# Patient Record
Sex: Female | Born: 1960 | Race: White | Hispanic: No | Marital: Married | Smoking: Current every day smoker
Health system: Southern US, Community
[De-identification: ages and names within clinical notes are randomized; demographics above are authoritative.]

## PROBLEM LIST (undated history)

## (undated) DIAGNOSIS — Z973 Presence of spectacles and contact lenses: Secondary | ICD-10-CM

## (undated) DIAGNOSIS — B009 Herpesviral infection, unspecified: Secondary | ICD-10-CM

## (undated) DIAGNOSIS — R51 Headache: Secondary | ICD-10-CM

## (undated) HISTORY — PX: CLOSED REDUCTION ELBOW DISLOCATION: SUR215

## (undated) HISTORY — DX: Presence of spectacles and contact lenses: Z97.3

## (undated) HISTORY — DX: Herpesviral infection, unspecified: B00.9

## (undated) HISTORY — DX: Headache: R51

---

## 2007-07-25 ENCOUNTER — Emergency Department (HOSPITAL_BASED_OUTPATIENT_CLINIC_OR_DEPARTMENT_OTHER): Admission: EM | Admit: 2007-07-25 | Discharge: 2007-07-25 | Payer: Self-pay | Admitting: Emergency Medicine

## 2008-06-07 ENCOUNTER — Ambulatory Visit: Payer: Self-pay | Admitting: Diagnostic Radiology

## 2008-06-07 ENCOUNTER — Ambulatory Visit (HOSPITAL_BASED_OUTPATIENT_CLINIC_OR_DEPARTMENT_OTHER): Admission: RE | Admit: 2008-06-07 | Discharge: 2008-06-07 | Payer: Self-pay | Admitting: Obstetrics and Gynecology

## 2009-09-15 ENCOUNTER — Encounter: Admission: RE | Admit: 2009-09-15 | Discharge: 2009-09-15 | Payer: Self-pay | Admitting: Obstetrics and Gynecology

## 2010-04-15 ENCOUNTER — Other Ambulatory Visit: Payer: Self-pay | Admitting: Obstetrics and Gynecology

## 2010-04-15 DIAGNOSIS — Z09 Encounter for follow-up examination after completed treatment for conditions other than malignant neoplasm: Secondary | ICD-10-CM

## 2010-04-21 ENCOUNTER — Ambulatory Visit
Admission: RE | Admit: 2010-04-21 | Discharge: 2010-04-21 | Disposition: A | Discharge status: Home / Self Care | Payer: 59 | Source: Ambulatory Visit | Attending: Obstetrics and Gynecology | Admitting: Obstetrics and Gynecology

## 2010-04-21 DIAGNOSIS — Z09 Encounter for follow-up examination after completed treatment for conditions other than malignant neoplasm: Secondary | ICD-10-CM

## 2010-08-27 ENCOUNTER — Encounter (INDEPENDENT_AMBULATORY_CARE_PROVIDER_SITE_OTHER): Payer: Self-pay | Admitting: General Surgery

## 2010-08-28 ENCOUNTER — Encounter (INDEPENDENT_AMBULATORY_CARE_PROVIDER_SITE_OTHER): Payer: Self-pay | Admitting: General Surgery

## 2010-08-28 ENCOUNTER — Ambulatory Visit (INDEPENDENT_AMBULATORY_CARE_PROVIDER_SITE_OTHER): Payer: 59 | Admitting: General Surgery

## 2010-08-28 VITALS — BP 112/78 | HR 72 | Temp 97.3°F | Ht 67.0 in | Wt 155.0 lb

## 2010-08-28 DIAGNOSIS — D171 Benign lipomatous neoplasm of skin and subcutaneous tissue of trunk: Secondary | ICD-10-CM

## 2010-08-28 DIAGNOSIS — D1739 Benign lipomatous neoplasm of skin and subcutaneous tissue of other sites: Secondary | ICD-10-CM

## 2010-08-28 NOTE — Progress Notes (Signed)
Stephanie Pitts is a 50 y.o. female.    Chief Complaint  Patient presents with  . Other    lump on left side    HPI HPI Pt felt a mass along her left abdominal wall several weeks ago.  She felt some discomfort when she lay on that side, pressing on it.  It has not been red.  She has not needed to take analgesics for it.  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  She denies skin changes.    Past Medical History  Diagnosis Date  . Herpes   . Headache   . Wears glasses     Past Surgical History  Procedure Date  . Cesarean section   . Closed reduction elbow dislocation     Family History  Problem Relation Age of Onset  . Diabetes Father     Social History History  Substance Use Topics  . Smoking status: Never Smoker   . Smokeless tobacco: Not on file  . Alcohol Use: 0.6 oz/week    1 Glasses of wine per week    No Known Allergies  Current Outpatient Prescriptions  Medication Sig Dispense Refill  . Naproxen Sodium (ALEVE PO) Take by mouth 2 (two) times a week.          Review of Systems Review of Systems  Constitutional: Negative.   HENT: Negative.   Eyes: Negative.   Respiratory: Negative.   Cardiovascular: Negative.   Gastrointestinal: Negative.   Genitourinary: Negative.   Musculoskeletal: Negative.   Skin: Negative.   Neurological:       Headaches     Physical Exam Physical Exam  Constitutional: She is oriented to person, place, and time. She appears well-developed and well-nourished.  HENT:  Head: Normocephalic and atraumatic.  Mouth/Throat: Oropharynx is clear and moist. No oropharyngeal exudate.  Eyes: Conjunctivae are normal. Pupils are equal, round, and reactive to light. No scleral icterus.  Neck: Normal range of motion. Neck supple. No tracheal deviation present. No thyromegaly present.  Cardiovascular: Normal rate, regular rhythm and intact distal pulses.   Respiratory: Effort normal.  No respiratory distress. She exhibits no tenderness.  GI: Soft. Bowel sounds are normal. She exhibits mass (see diagram). She exhibits no distension. There is no tenderness. There is no rebound and no guarding.         2 x 4 cm flat mass over 11th rib  Musculoskeletal: Normal range of motion. She exhibits no edema and no tenderness.  Lymphadenopathy:    She has no cervical adenopathy.  Neurological: She is alert and oriented to person, place, and time. Coordination normal.  Skin: Skin is warm and dry. No rash noted. No erythema. No pallor.          Sacral mass, fatty, difficult to palpate, 1.5-2 cm  Psychiatric: She has a normal mood and affect. Her behavior is normal. Judgment and thought content normal.     There were no vitals taken for this visit.  Assessment/Plan Lipoma of abdominal wall Follow up if mass is enlarging or more painful I can resect mass, but there is a risk of infection, bleeding, post op pain, and expense. Right now, mass is small and minimally symptomatic.        [name] 08/28/2010, 10:58 AM

## 2010-08-28 NOTE — Assessment & Plan Note (Signed)
Follow up if mass is enlarging or more painful I can resect mass, but there is a risk of infection, bleeding, post op pain, and expense. Right now, mass is small and minimally symptomatic.

## 2011-10-16 IMAGING — US US BREAST L
1 series · 4 of 4 positions shown · non-contrast
Comparison: 06/07/2008

CLINICAL DATA: Left breast mass

DIGITAL DIAGNOSTIC BILATERAL MAMMOGRAM WITH CAD AND LEFT BREAST
ULTRASOUND:

[Series 1: us breast left · 4 of 4 slices shown]
[im 1/4]
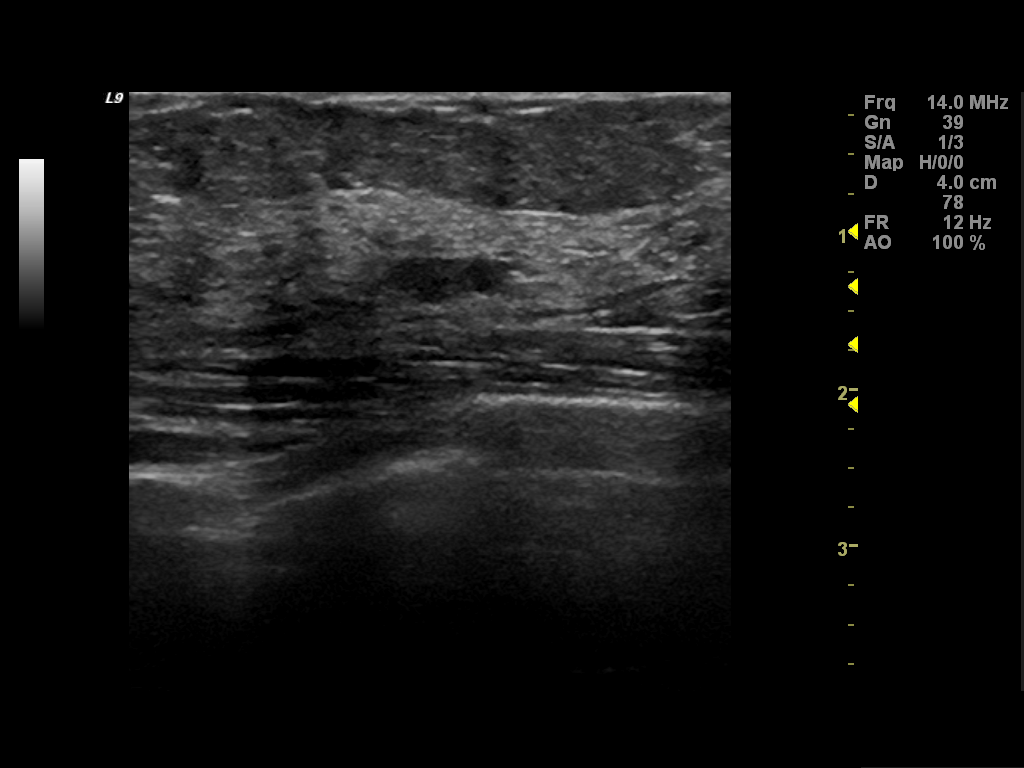
[im 2/4]
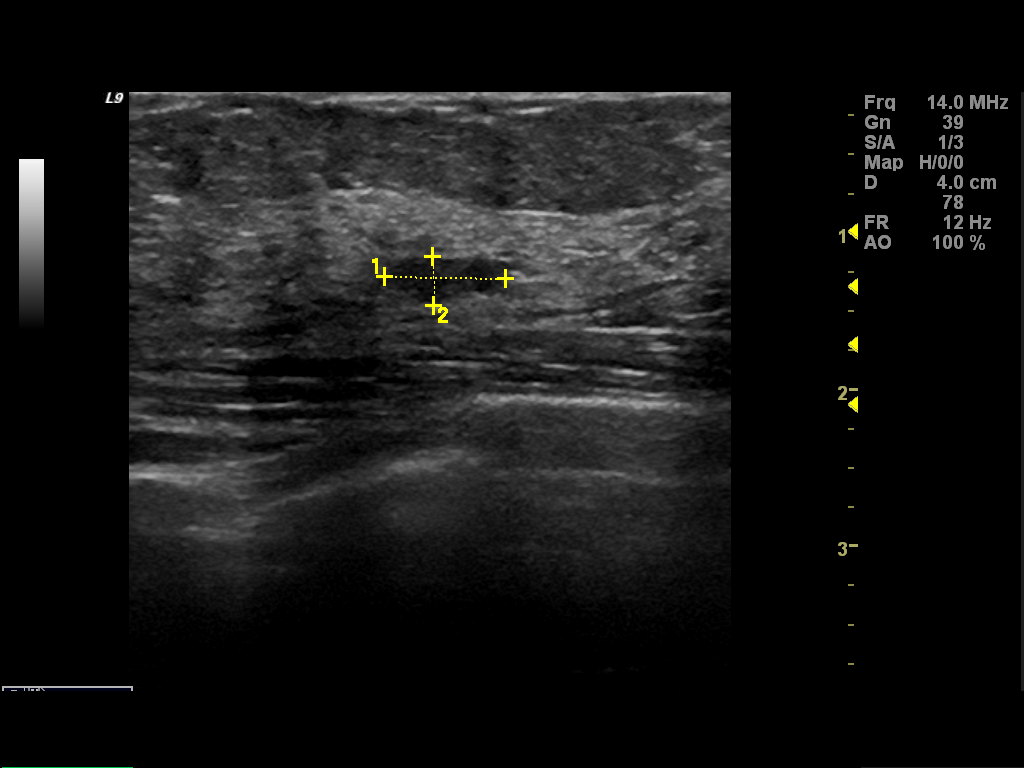
[im 3/4]
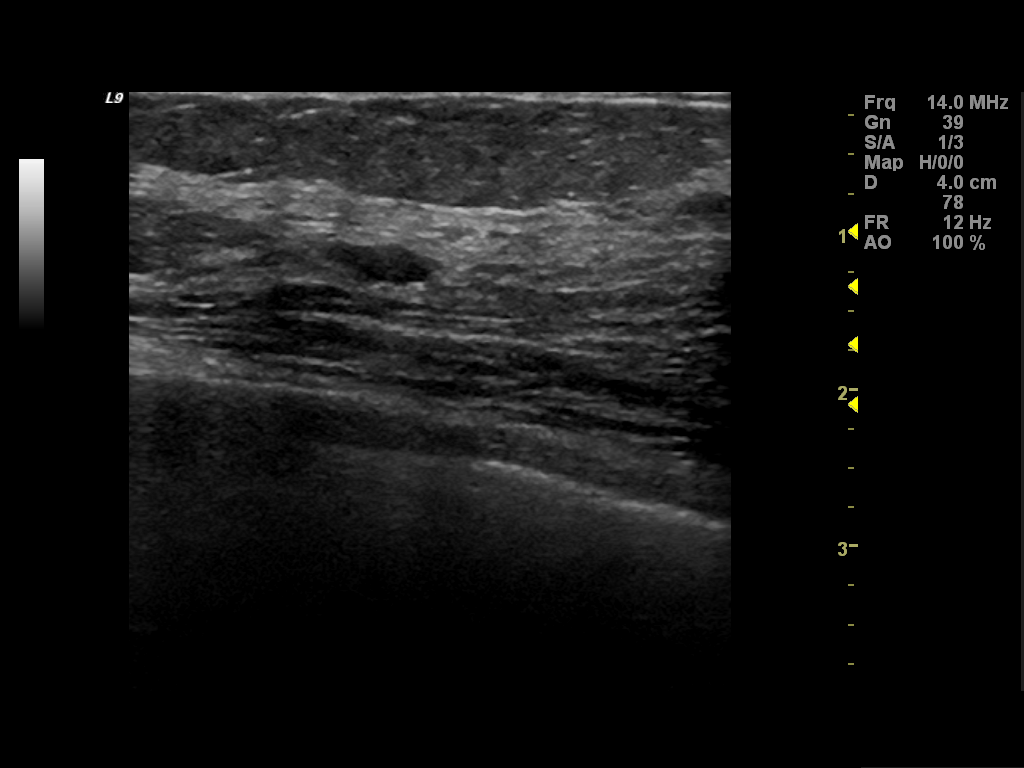
[im 4/4]
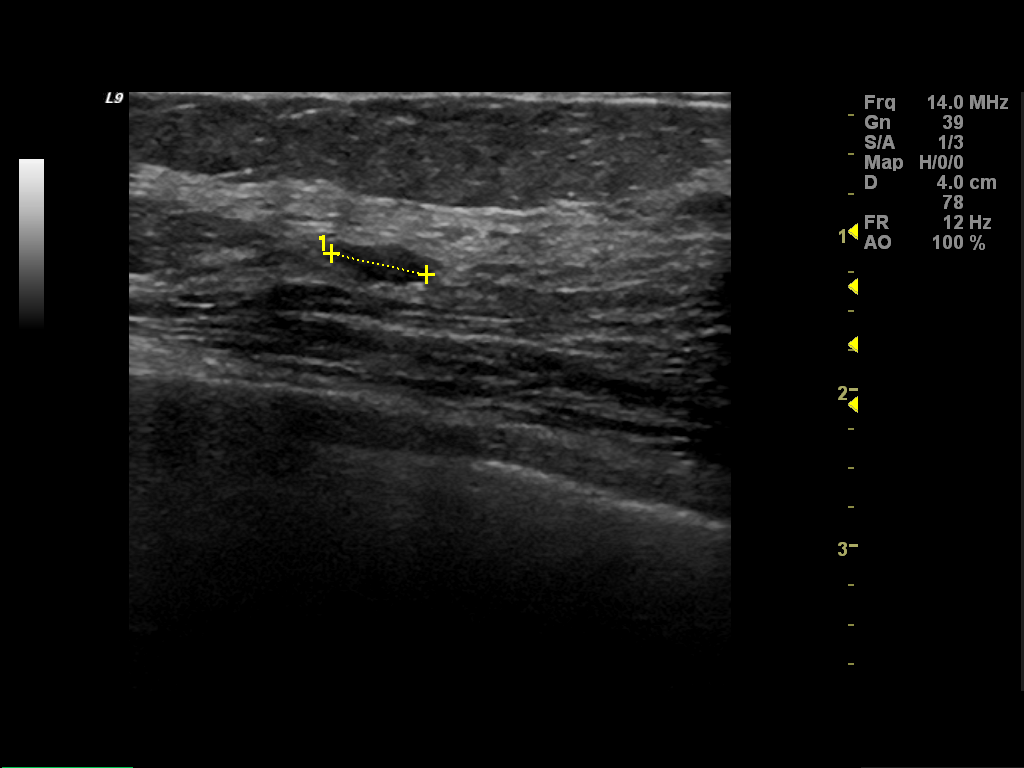

[4 of 4 positions shown; findings below may reference images not displayed]

FINDINGS: There is a dense fibroglandular pattern.  No mass or
malignant type calcifications are seen.  Fibroglandular tissue is
imaged on the spot tangential view in the site of concern in the
lateral central aspect of the left breast.  Compared with priors.
Mammographic images were processed with CAD.

On physical exam, I palpate normal tissue in the lateral central
aspect of the right breast.

Ultrasound is performed, showing a hypoechoic, circumscribed mass
with posterior acoustic enhancement in the 3 o'clock position, 3 cm
from the left nipple measuring 0.8 x 0.3 x 0.6 cm.
IMPRESSION: Probable fibroadenoma, left breast.  An ultrasound is recommended
in 6 months to evaluate for stability.  No mammographic evidence of
malignancy, right breast.

BI-RADS CATEGORY 3:  Probably benign finding(s) - short interval
follow-up suggested.

## 2012-05-21 IMAGING — US US BREAST L
1 series · 4 of 4 positions shown · non-contrast
Comparison: September 15, 2009

CLINICAL DATA: 6-month reevaluation left breast

LEFT BREAST ULTRASOUND

[Series 1: us breast left · 4 of 4 slices shown]
[im 1/4]
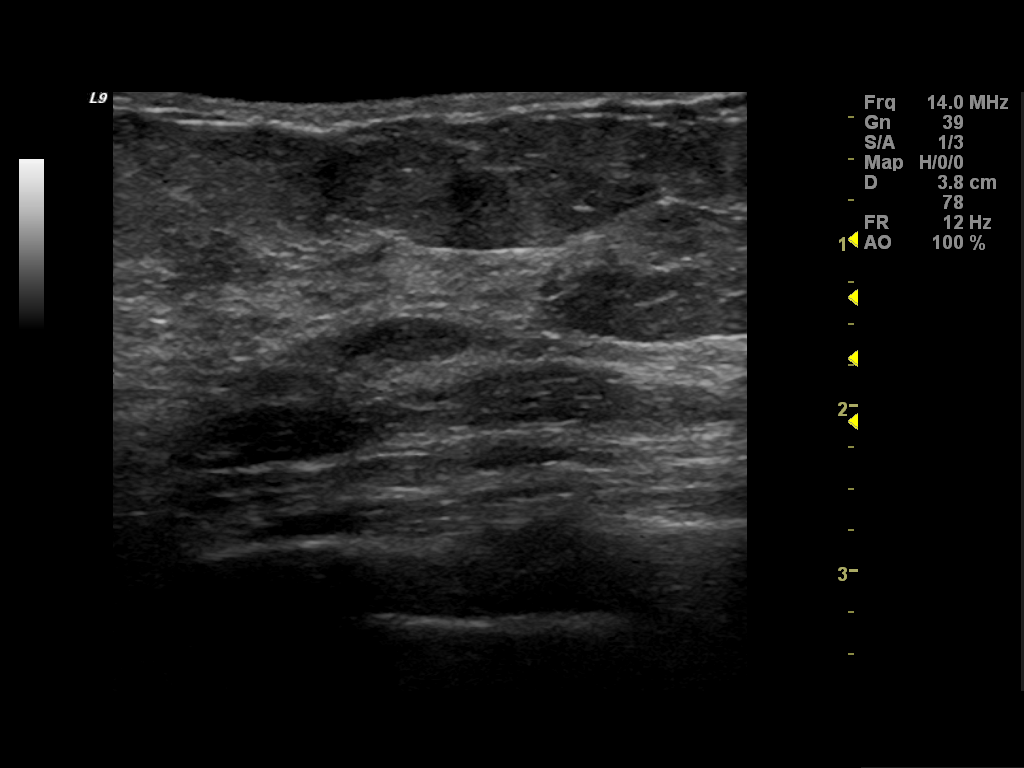
[im 2/4]
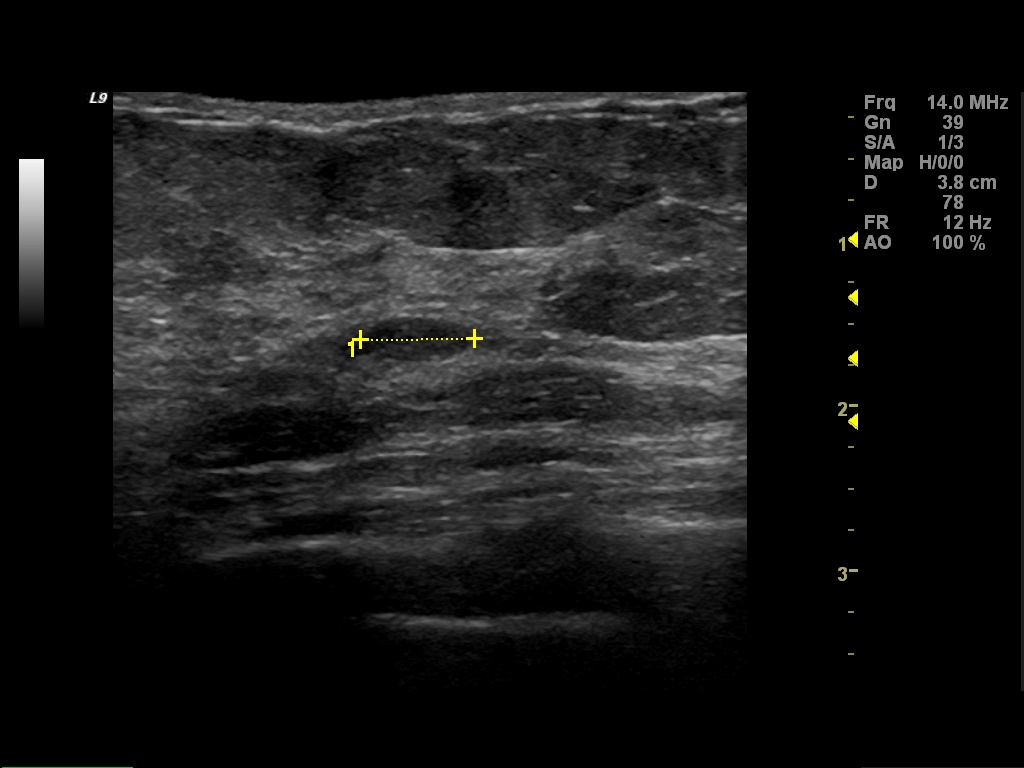
[im 3/4]
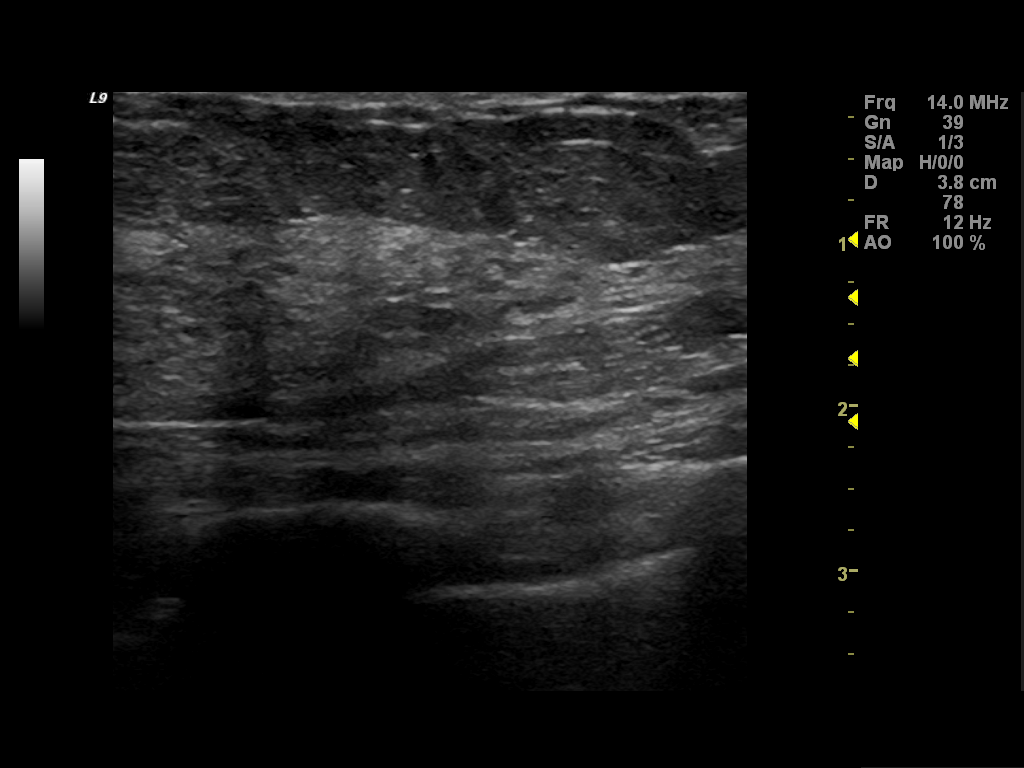
[im 4/4]
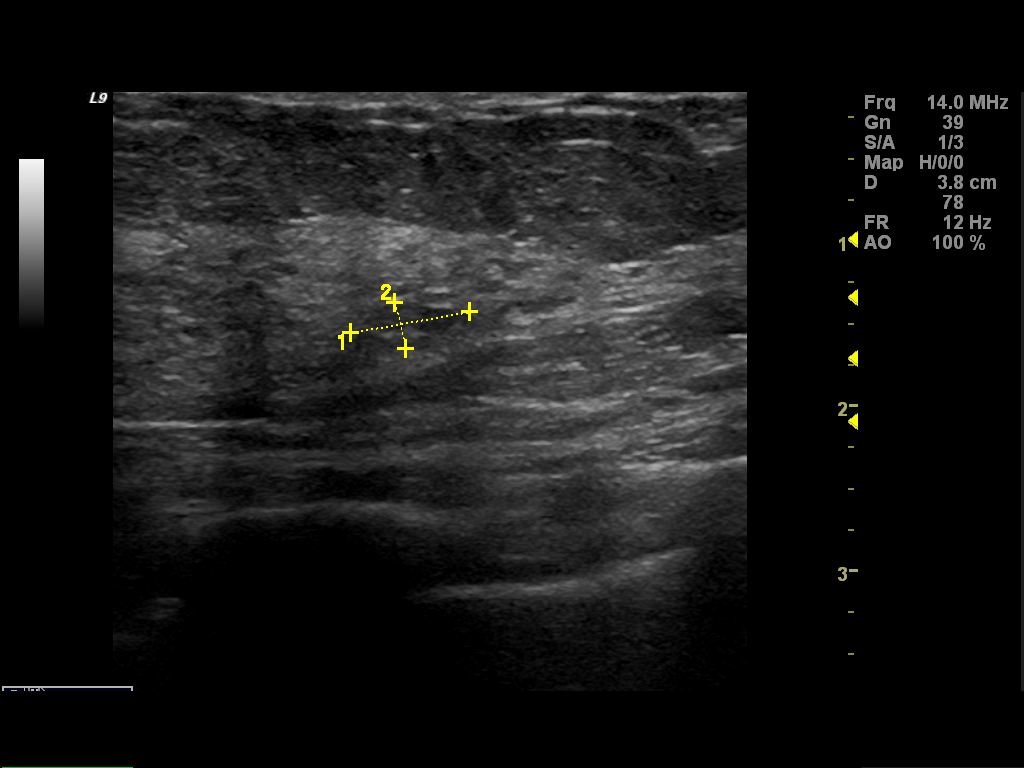

[4 of 4 positions shown; findings below may reference images not displayed]

FINDINGS: Ultrasound is performed, showing  0.8 cm oval hypoechoic
lesion at the left breast three o'clock position 6 cm from the
nipple unchanged compared to previous exam.
IMPRESSION: Probable benign findings, recommend 6-month follow-up left breast

BI-RADS CATEGORY 3:  Probably benign finding(s) - short interval
follow-up suggested.

## 2014-10-17 ENCOUNTER — Telehealth: Payer: Self-pay | Admitting: Pain Medicine

## 2014-10-17 NOTE — Telephone Encounter (Signed)
Thank you :)

## 2014-10-17 NOTE — Telephone Encounter (Signed)
Patient has not had MRI at this time / will call to resched after she get that done
